# Patient Record
Sex: Female | Born: 1955 | Race: White | Hispanic: No | Marital: Single | Smoking: Never smoker
Health system: Southern US, Community
[De-identification: ages and names within clinical notes are randomized; demographics above are authoritative.]

## PROBLEM LIST (undated history)

## (undated) DIAGNOSIS — G43909 Migraine, unspecified, not intractable, without status migrainosus: Secondary | ICD-10-CM

## (undated) HISTORY — PX: NO PAST SURGERIES: SHX2092

## (undated) HISTORY — DX: Migraine, unspecified, not intractable, without status migrainosus: G43.909

---

## 2012-09-15 ENCOUNTER — Ambulatory Visit: Payer: Self-pay | Admitting: Nurse Practitioner

## 2014-05-08 ENCOUNTER — Ambulatory Visit: Payer: Self-pay | Admitting: Family Medicine

## 2015-01-16 ENCOUNTER — Ambulatory Visit: Payer: Self-pay

## 2015-02-01 ENCOUNTER — Encounter: Payer: Self-pay | Admitting: Urology

## 2015-02-01 ENCOUNTER — Ambulatory Visit (INDEPENDENT_AMBULATORY_CARE_PROVIDER_SITE_OTHER): Payer: BLUE CROSS/BLUE SHIELD | Admitting: Urology

## 2015-02-01 VITALS — BP 94/60 | HR 71 | Ht 64.75 in | Wt 137.5 lb

## 2015-02-01 DIAGNOSIS — N200 Calculus of kidney: Secondary | ICD-10-CM

## 2015-02-01 LAB — URINALYSIS, COMPLETE
Bilirubin, UA: NEGATIVE
Glucose, UA: NEGATIVE
KETONES UA: NEGATIVE
NITRITE UA: NEGATIVE
Protein, UA: NEGATIVE
SPEC GRAV UA: 1.015 (ref 1.005–1.030)
Urobilinogen, Ur: 0.2 mg/dL (ref 0.2–1.0)
pH, UA: 8.5 — ABNORMAL HIGH (ref 5.0–7.5)

## 2015-02-01 LAB — MICROSCOPIC EXAMINATION: RBC MICROSCOPIC, UA: NONE SEEN /HPF (ref 0–?)

## 2015-02-01 NOTE — Progress Notes (Signed)
Cc: Microscopic hematuria  History of present illness: 59 year old white female who was referred by Sharee Pimple, in the further evaluation and management of microscopic hematuria and left-sided nephrolithiasis. The patient initially presented to her primary care provider with right-sided flank pain. She underwent a CT scan, stone protocol, which demonstrated a 3 mm left-sided nonobstructing kidney stone. Recognizing this was not the source of her right-sided flank pain she then had spine films which were read as normal. She subsequently went to the physical therapist who then referred her to an chiropractor. This point, her right-sided flank pain has improved somewhat. However, given that she was found to have dipstick positive microscopic hematuria and the nonobstructing stone on the left, she was referred to urology for further evaluation. The patient has a history of severe/stabbing pain many years ago which she attributed to a kidney stone. This resolved on its own. She's never had a formal workup for this. She's never been told she has a kidney stone. She has no family history of kidney stones. She denies any left-sided flank pain, gross hematuria, progressive voiding symptoms, or fever/chills. In addition, the patient has no occupational exposures 2 industrial dyes or organic solvents. She is a nonsmoker. She is otherwise quite healthy. She does not take a lot of vitamin C, calcium supplements, nor medications that may cause stone formation.  A comprehensive review of systems was obtained and is otherwise negative aside from frequent urination, night sweats, and cough. The patient also has history of headaches. Past Medical History  Diagnosis Date  . Migraine    Past Surgical History  Procedure Laterality Date  . No past surgeries     No current outpatient prescriptions on file prior to visit.   No current facility-administered medications on file prior to visit.   PE: NAD Filed Vitals:    02/01/15 0843  BP: 94/60  Pulse: 71  mucosal membranes are moist Head is normocephalic, hearing normal nonlabored breathing Patient's extremities are well-perfused, no peripheral edema Her abdomen is flat There is no CVA tenderness There are no skin lesions or rashes  Mood is appropriate, affect is bright No appreciable lymphadenopathy Gait is normal, no gross neurological deficits  CT scan: I reviewed the patient's CT scan report, the images were not available to me. The formal read noted a 3 mm nonobstructing stone in the left kidney, there were no other GU abnormalities. UA demonstrates dipstick positive hematuria, no evidence of gross hematuria or infections.  Impression: The patient has an asymptomatic nonobstructing stone on the left. She has dipstick positive hematuria, no evidence of microscopic hematuria today or at her previous encounter with her primary care provider. Recommendations: I recommended that the patient treat her stone conservatively. We discussed stone prevention strategies including increased fluid intake, lemonade therapy, low salt diet, and avoidance of calcium supplements and excessive vitamin C intake. Given the patient does not have a history of kidney stones or family history of kidney stones, and she is healthy otherwise, I suggested that the patient follow-up with [place] on an as-needed basis. I suspect the stone will remain asymptomatic, but if it does, she is likely to pass it on her own. However, I'm always happy to see the patient back if things change.  Cc: Sharee Pimple, NP

## 2015-04-26 ENCOUNTER — Other Ambulatory Visit: Payer: Self-pay | Admitting: Family Medicine

## 2015-04-26 DIAGNOSIS — Z1231 Encounter for screening mammogram for malignant neoplasm of breast: Secondary | ICD-10-CM

## 2016-01-17 IMAGING — MG MM DIGITAL SCREENING BILAT W/ CAD
4 series · 4 of 4 positions shown · non-contrast
Comparison: Previous exam(s).

CLINICAL DATA: Screening.

EXAM:
DIGITAL SCREENING BILATERAL MAMMOGRAM WITH CAD

[R MLO]
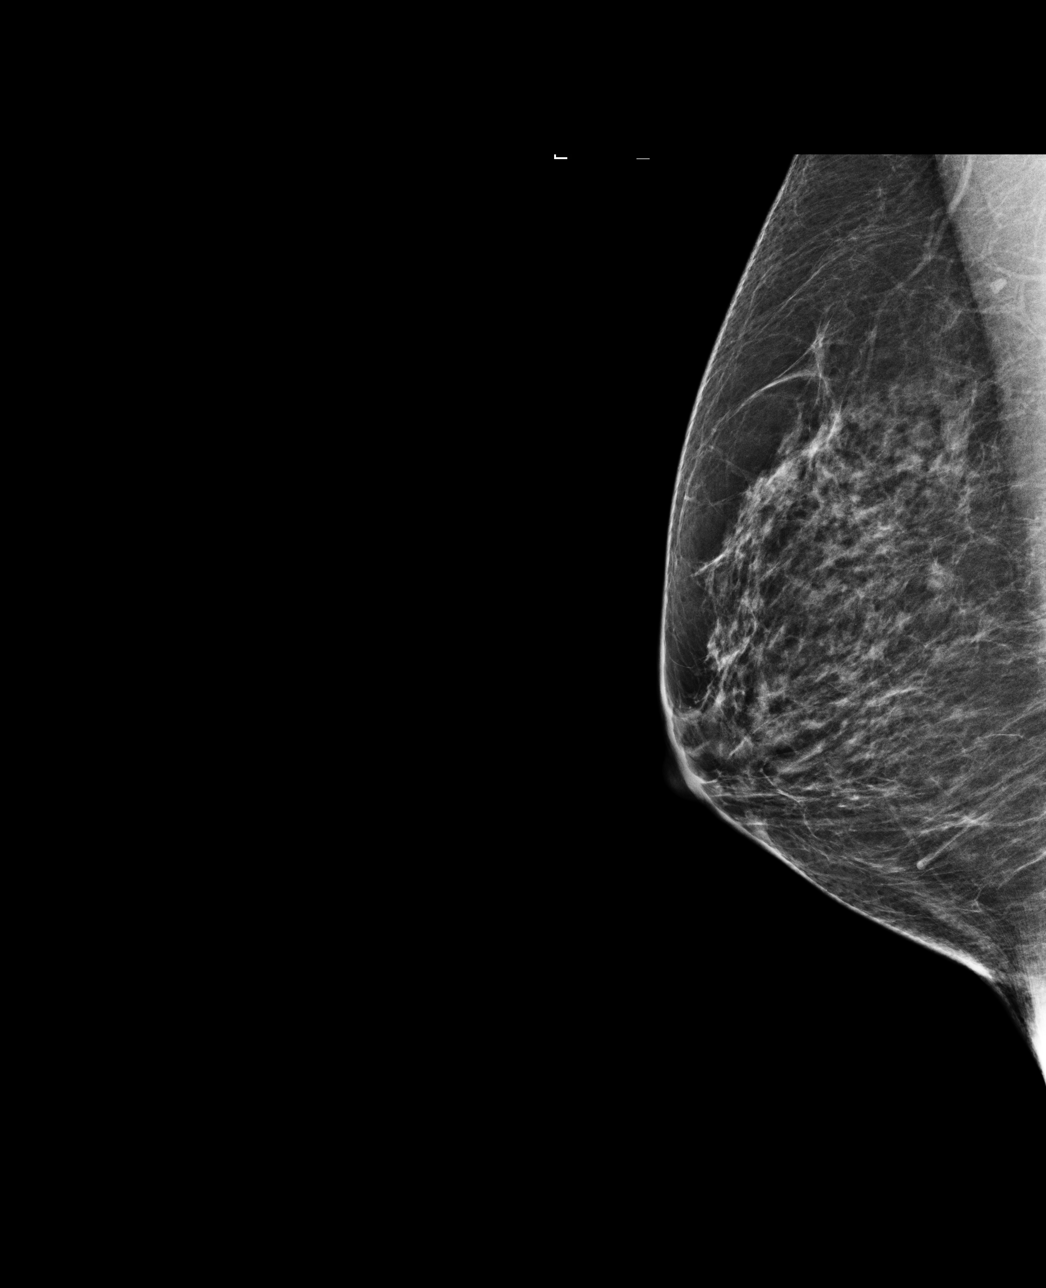

[L CC]
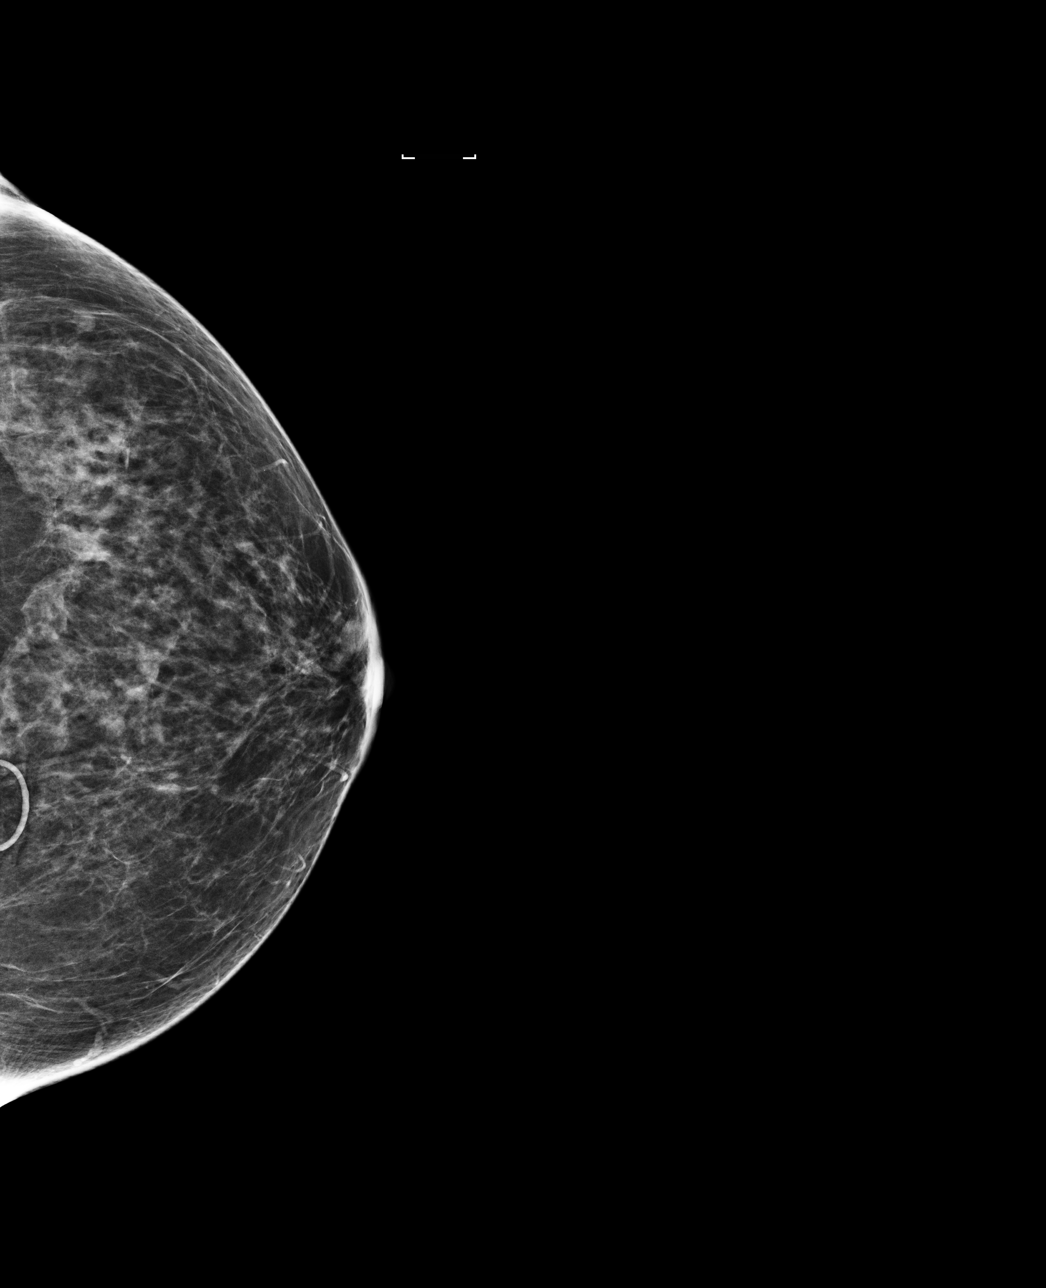

[R CC]
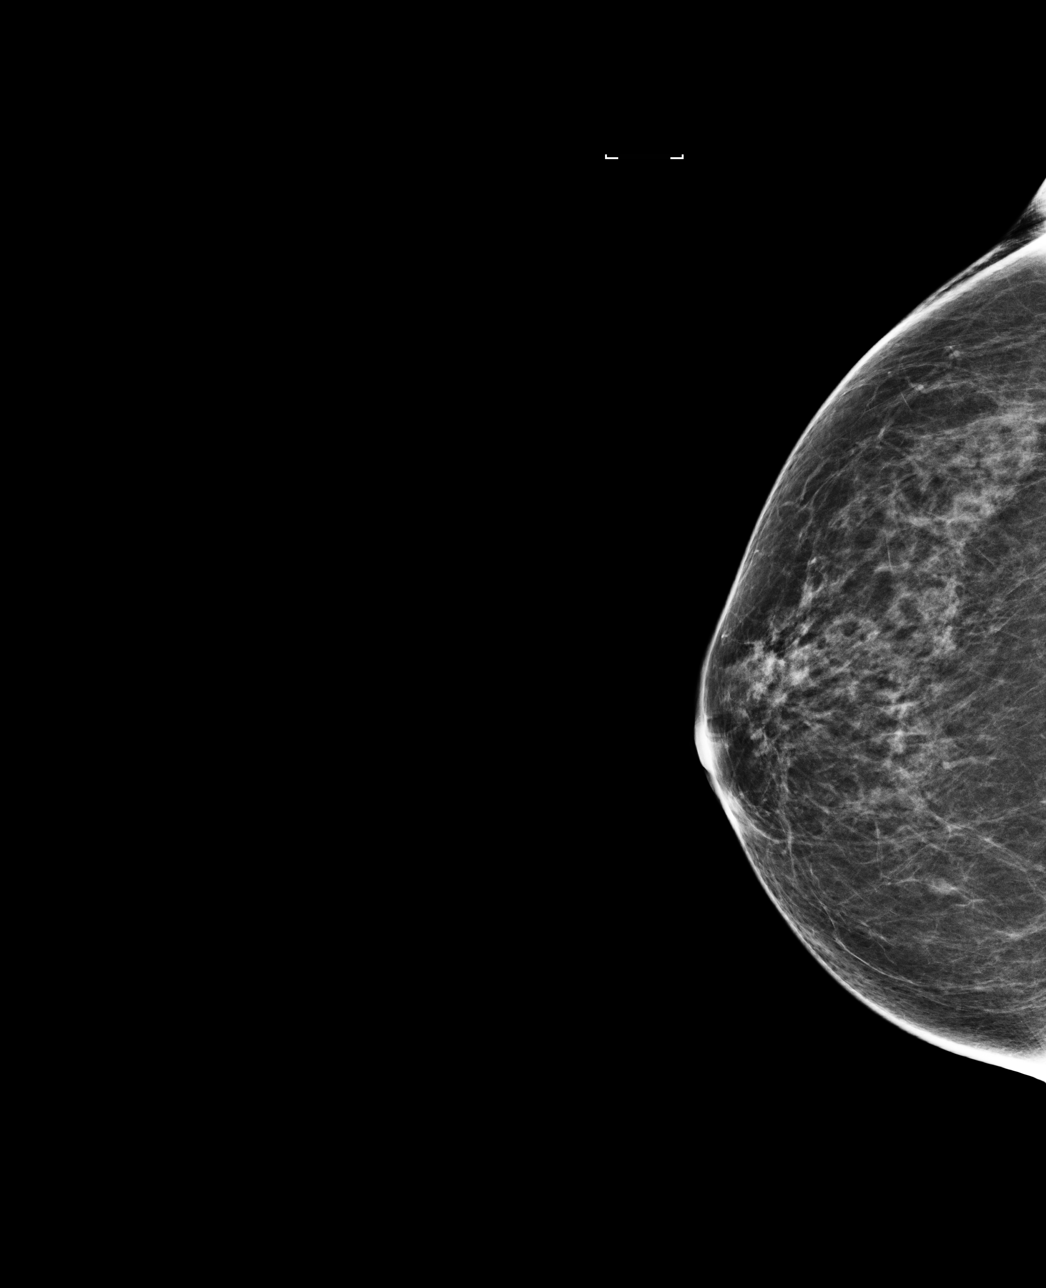

[L MLO]
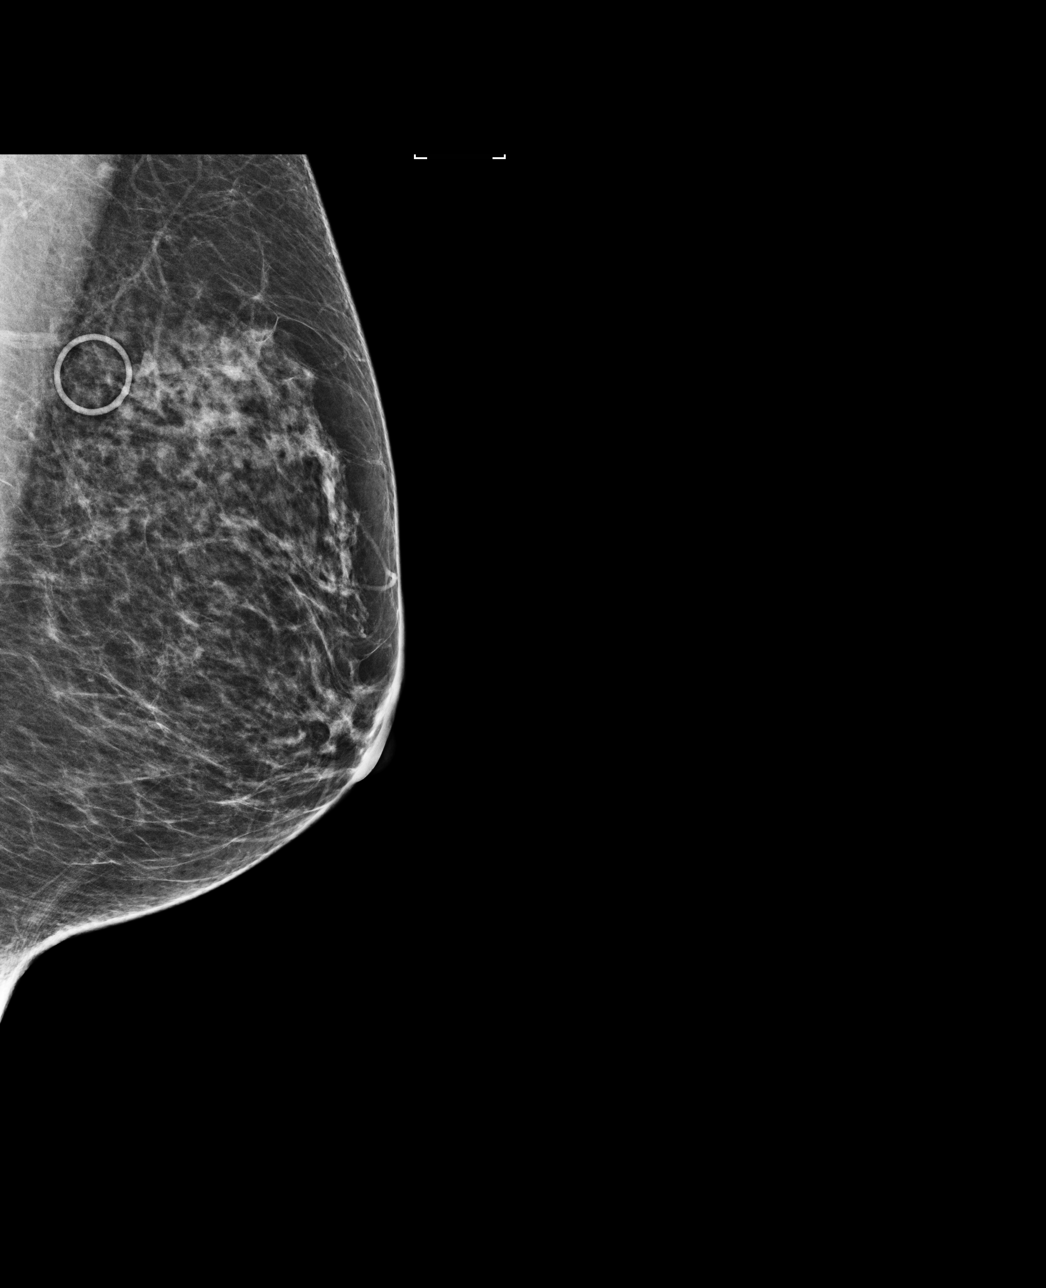

[4 of 4 positions shown; findings below may reference images not displayed]

ACR Breast Density Category c: The breast tissue is heterogeneously
dense, which may obscure small masses.
FINDINGS: There are no findings suspicious for malignancy. Images were
processed with CAD.
IMPRESSION: No mammographic evidence of malignancy. A result letter of this
screening mammogram will be mailed directly to the patient.

RECOMMENDATION:
Screening mammogram in one year. (Code:YJ-[id number])

BI-RADS CATEGORY  1: Negative.

## 2017-06-04 ENCOUNTER — Other Ambulatory Visit: Payer: Self-pay | Admitting: Family Medicine

## 2017-06-04 DIAGNOSIS — Z1231 Encounter for screening mammogram for malignant neoplasm of breast: Secondary | ICD-10-CM

## 2017-08-13 ENCOUNTER — Ambulatory Visit
Admission: RE | Admit: 2017-08-13 | Discharge: 2017-08-13 | Disposition: A | Discharge status: Home / Self Care | Payer: BLUE CROSS/BLUE SHIELD | Source: Ambulatory Visit | Attending: Family Medicine | Admitting: Family Medicine

## 2017-08-13 DIAGNOSIS — Z1231 Encounter for screening mammogram for malignant neoplasm of breast: Secondary | ICD-10-CM | POA: Diagnosis present

## 2019-04-24 IMAGING — MG MM DIGITAL SCREENING BILAT W/ CAD
4 series · 4 of 4 positions shown · non-contrast
Comparison: Previous exam(s).

CLINICAL DATA: Screening.

EXAM:
DIGITAL SCREENING BILATERAL MAMMOGRAM WITH CAD

[L MLO]
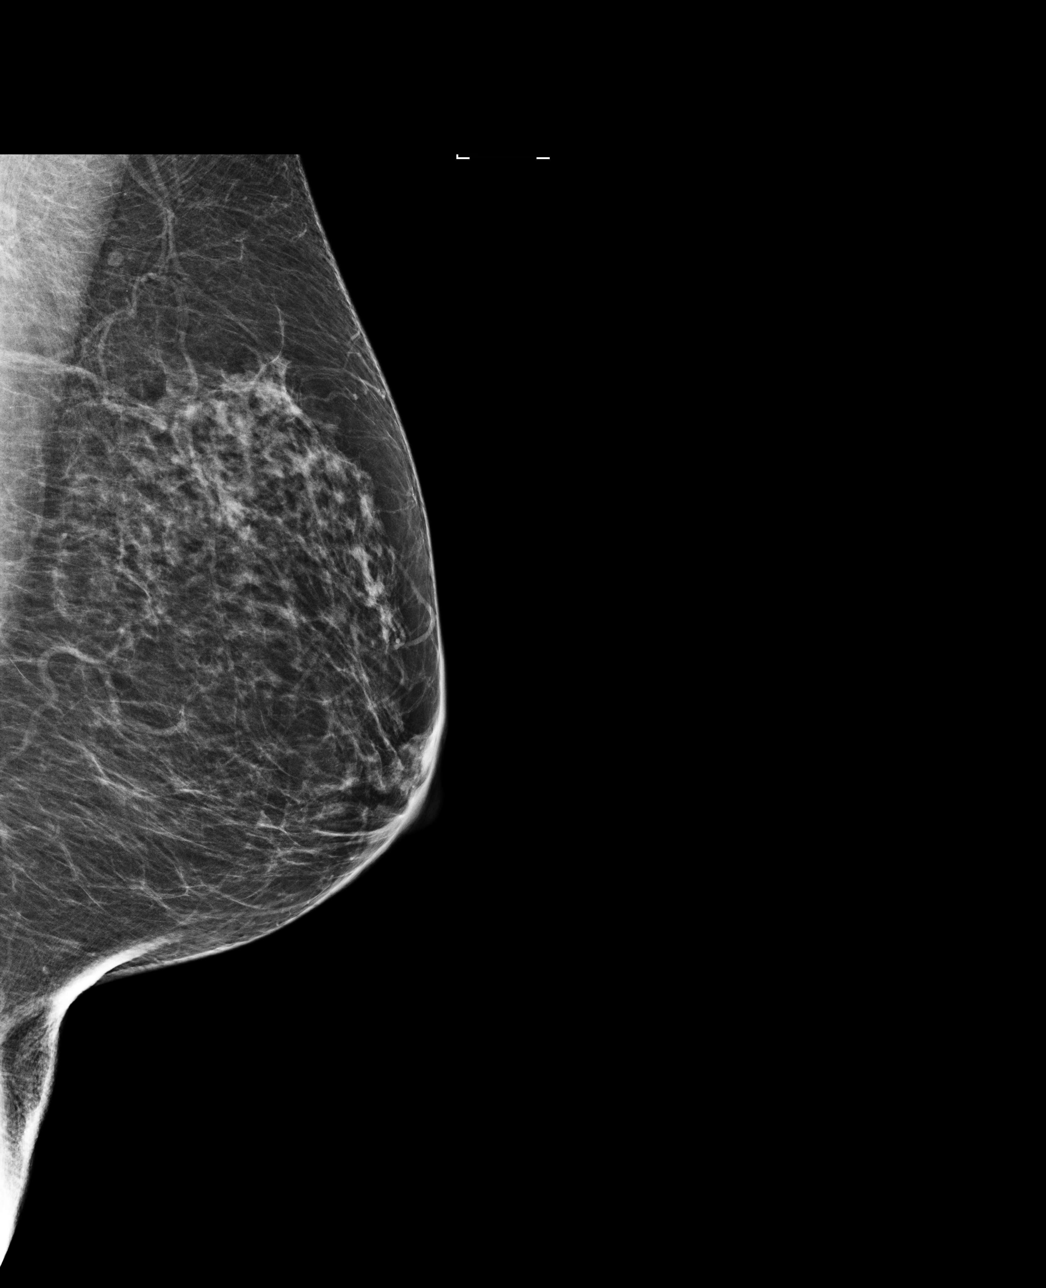

[R MLO]
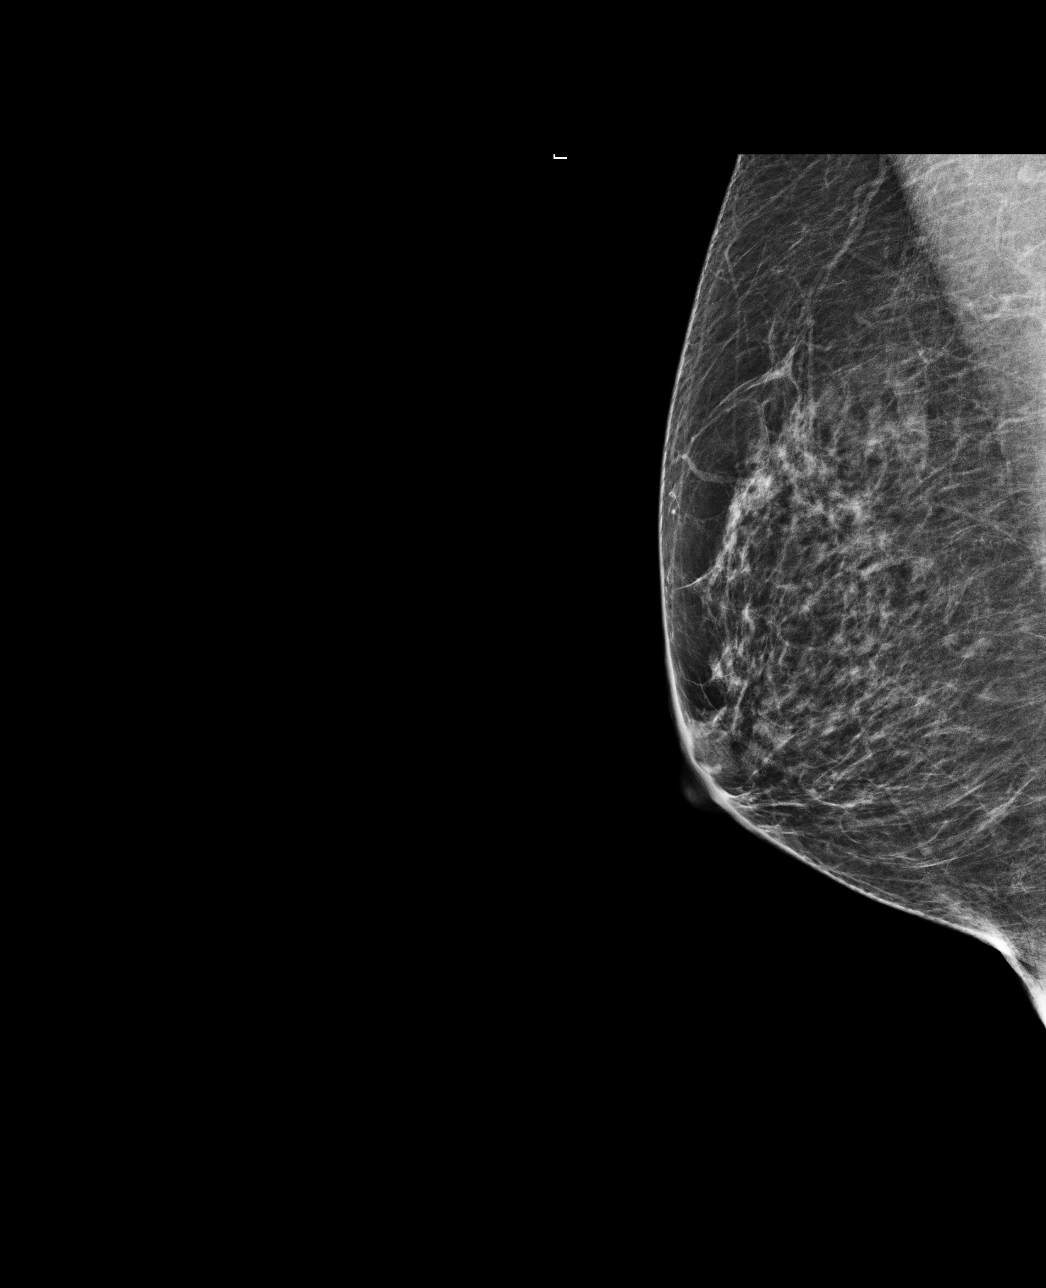

[L CC]
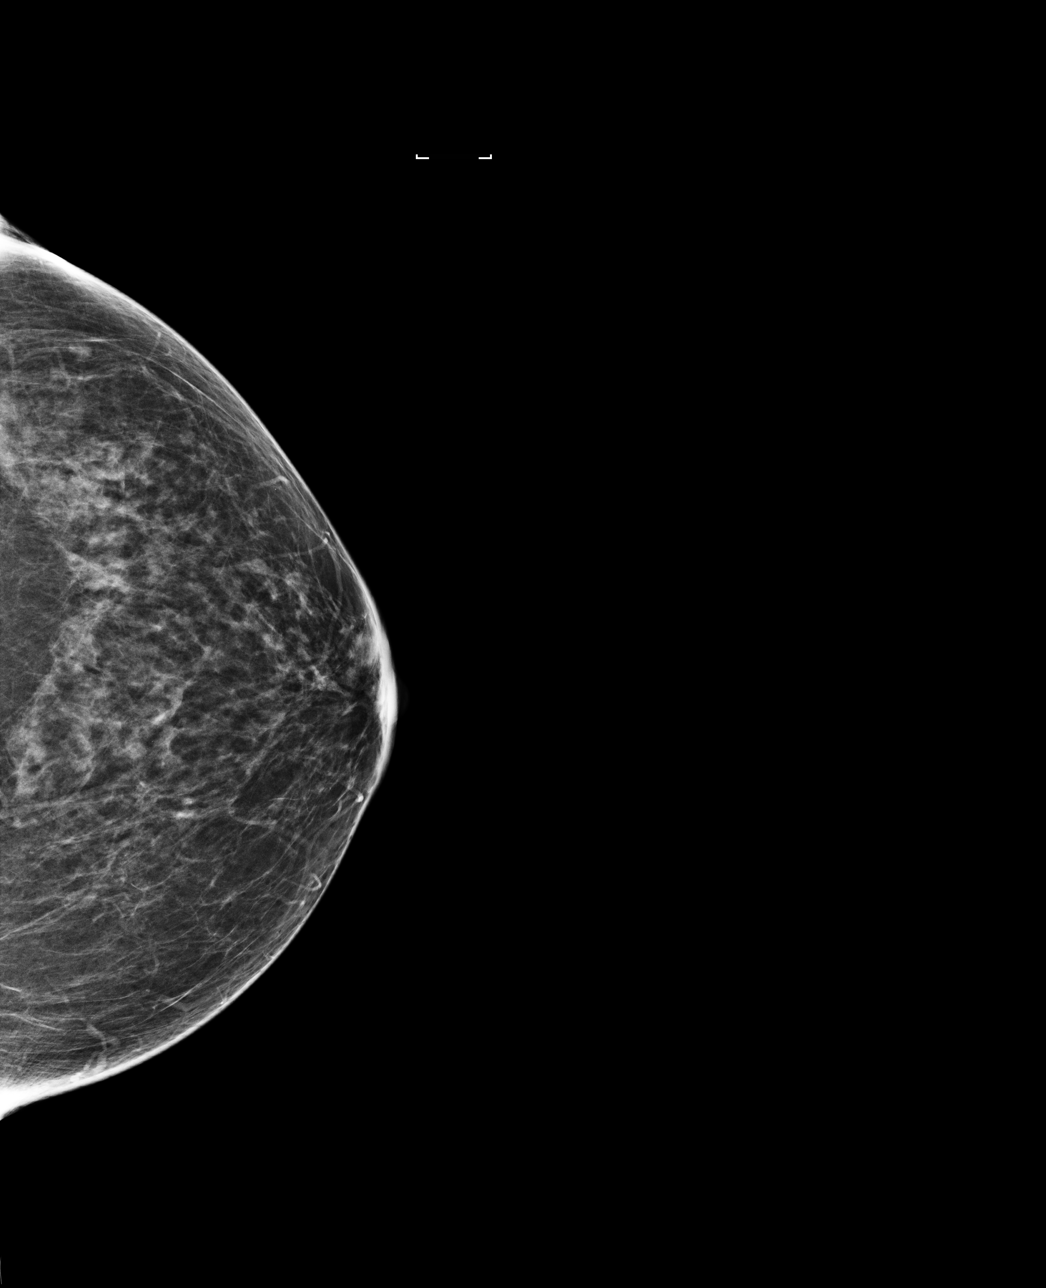

[R CC]
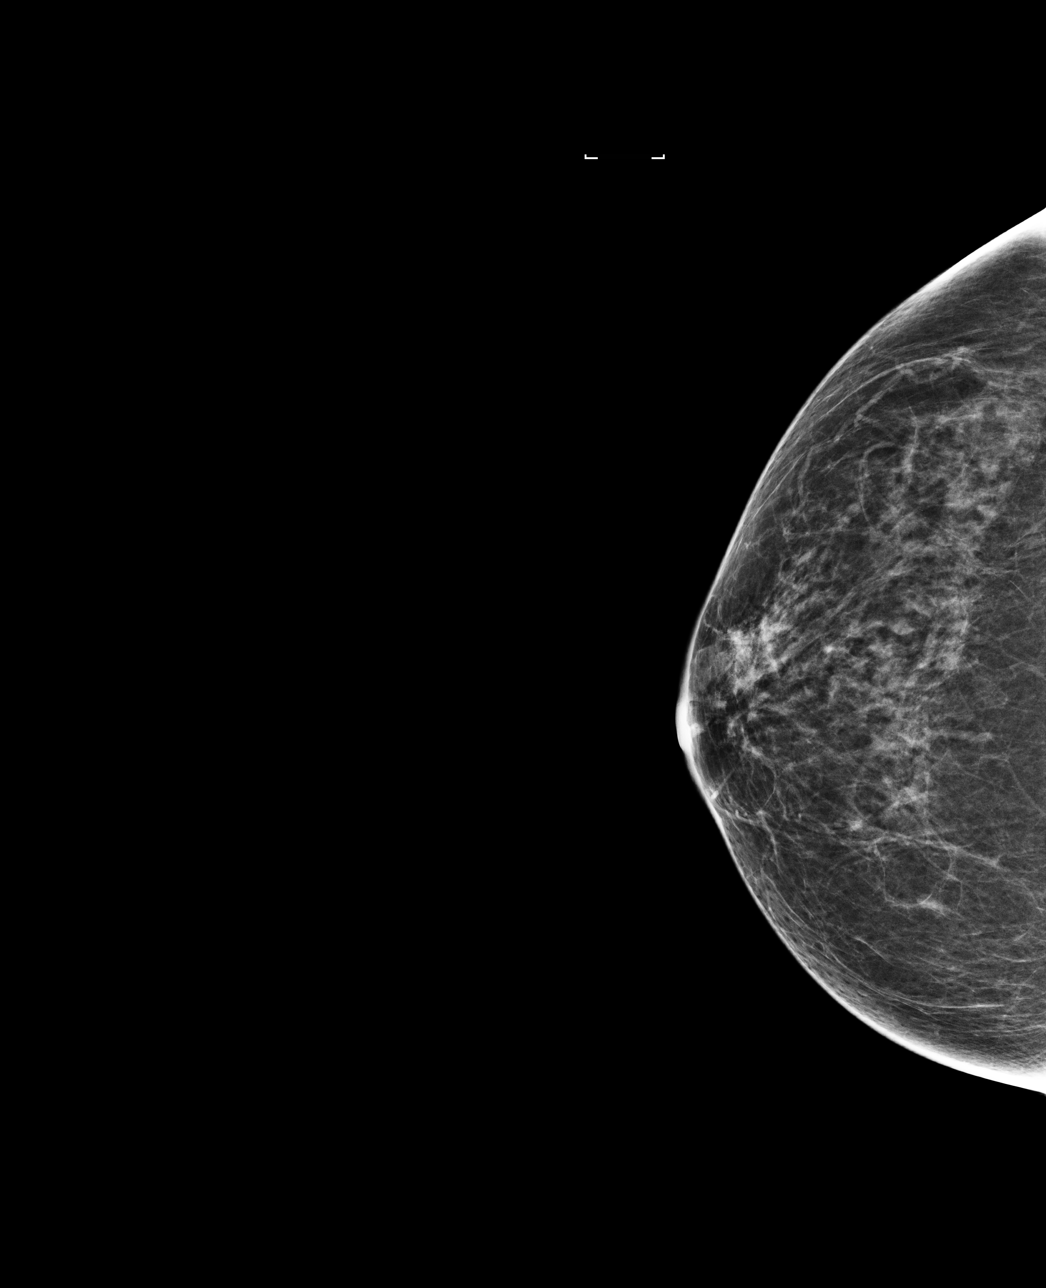

[4 of 4 positions shown; findings below may reference images not displayed]

ACR Breast Density Category b: There are scattered areas of
fibroglandular density.
FINDINGS: There are no findings suspicious for malignancy. Images were
processed with CAD.
IMPRESSION: No mammographic evidence of malignancy. A result letter of this
screening mammogram will be mailed directly to the patient.

RECOMMENDATION:
Screening mammogram in one year. (Code:[id number]-LCT)

BI-RADS CATEGORY  1: Negative.

## 2020-02-13 ENCOUNTER — Other Ambulatory Visit: Payer: Self-pay | Admitting: Family Medicine

## 2020-02-13 DIAGNOSIS — Z1231 Encounter for screening mammogram for malignant neoplasm of breast: Secondary | ICD-10-CM

## 2020-03-23 ENCOUNTER — Other Ambulatory Visit: Payer: Self-pay

## 2020-03-23 ENCOUNTER — Ambulatory Visit
Admission: RE | Admit: 2020-03-23 | Discharge: 2020-03-23 | Disposition: A | Discharge status: Home / Self Care | Payer: BLUE CROSS/BLUE SHIELD | Source: Ambulatory Visit | Attending: Family Medicine | Admitting: Family Medicine

## 2020-03-23 DIAGNOSIS — Z1231 Encounter for screening mammogram for malignant neoplasm of breast: Secondary | ICD-10-CM | POA: Diagnosis not present

## 2021-10-28 ENCOUNTER — Other Ambulatory Visit: Payer: Self-pay | Admitting: Family Medicine

## 2021-10-28 DIAGNOSIS — G43709 Chronic migraine without aura, not intractable, without status migrainosus: Secondary | ICD-10-CM

## 2021-10-28 DIAGNOSIS — R413 Other amnesia: Secondary | ICD-10-CM

## 2021-11-06 ENCOUNTER — Ambulatory Visit
Admission: RE | Admit: 2021-11-06 | Discharge: 2021-11-06 | Disposition: A | Discharge status: Home / Self Care | Payer: Medicare Other | Source: Ambulatory Visit | Attending: Family Medicine | Admitting: Family Medicine

## 2021-11-06 DIAGNOSIS — R413 Other amnesia: Secondary | ICD-10-CM | POA: Diagnosis present

## 2021-11-06 DIAGNOSIS — G43709 Chronic migraine without aura, not intractable, without status migrainosus: Secondary | ICD-10-CM | POA: Insufficient documentation

## 2022-01-21 ENCOUNTER — Telehealth: Payer: Self-pay | Admitting: Cardiology

## 2022-01-21 NOTE — Telephone Encounter (Signed)
Patient states that she received a call and someone left a message asking if she had ever seen a cardiologist before. Patient is calling back to let us know that she has not. She states that she was also trying  to do the e-check-in online and was unable to complete the form fully and cannot figure out how to get back to it.

## 2022-01-21 NOTE — Telephone Encounter (Signed)
Patient return call to report that she has never seen a cardiologist.  Patient request call back to land line for further information.

## 2022-01-21 NOTE — Telephone Encounter (Signed)
Called patient back and informed her that she can complete the check in process when she arrives for her appointment. Patient was grateful for the call back.

## 2022-01-23 ENCOUNTER — Encounter: Payer: Self-pay | Admitting: Cardiology

## 2022-01-23 ENCOUNTER — Ambulatory Visit: Payer: Medicare Other | Attending: Cardiology | Admitting: Cardiology

## 2022-01-23 VITALS — BP 100/70 | HR 71 | Ht 65.0 in | Wt 127.2 lb

## 2022-01-23 DIAGNOSIS — I6529 Occlusion and stenosis of unspecified carotid artery: Secondary | ICD-10-CM

## 2022-01-23 DIAGNOSIS — E7800 Pure hypercholesterolemia, unspecified: Secondary | ICD-10-CM

## 2022-01-23 MED ORDER — ASPIRIN 81 MG PO TBEC: 81.0000 mg | DELAYED_RELEASE_TABLET | Freq: Every day | ORAL | 3 refills | Status: AC

## 2022-01-23 MED ORDER — ATORVASTATIN CALCIUM 40 MG PO TABS: 40.0000 mg | ORAL_TABLET | Freq: Every day | ORAL | 5 refills | Status: DC

## 2022-01-23 NOTE — Patient Instructions (Signed)
Medication Instructions:   Your physician has recommended you make the following change in your medication:   START taking Atorvastatin 40 MG once a day.  2.   START Aspirin 81 MG once a day.   *If you need a refill on your cardiac medications before your next appointment, please call your pharmacy*   Lab Work:   Your physician recommends that you return for a FASTING lipid profile:  The week prior to your 2 month follow up  - You will need to be fasting. Please do not have anything to eat or drink after midnight the morning you have the lab work. You may only have water or black coffee with no cream or sugar.   - Please go to the [facility]. You will check in at the front desk to the right as you walk into the atrium. Valet Parking is offered if needed. - No appointment needed. You may go any day between 7 am and 6 pm.    Follow-Up: At [facility], you and your health needs are our priority.  As part of our continuing mission to provide you with exceptional heart care, we have created designated Provider Care Teams.  These Care Teams include your primary Cardiologist (physician) and Advanced Practice Providers (APPs -  Physician Assistants and Nurse Practitioners) who all work together to provide you with the care you need, when you need it.  We recommend signing up for the patient portal called "MyChart".  Sign up information is provided on this After Visit Summary.  MyChart is used to connect with patients for Virtual Visits (Telemedicine).  Patients are able to view lab/test results, encounter notes, upcoming appointments, etc.  Non-urgent messages can be sent to your provider as well.   To learn more about what you can do with MyChart, go to [email or web address].    Your next appointment:   2 month(s)  The format for your next appointment:   In Person  Provider:   You may see Debbe Odea, MD or one of the following Advanced Practice Providers on your  designated Care Team:   Nicolasa Ducking, NP Eula Listen, PA-C Cadence Fransico Michael, PA-C Charlsie Quest, NP    Other Instructions   Important Information About Sugar

## 2022-01-23 NOTE — Progress Notes (Signed)
Cardiology Office Note:    Date:  01/23/2022   ID:  Sharon Chavez, DOB 1955/08/29, MRN [id number]  PCP:  Leanna Sato, MD   [facility] Providers Cardiologist:  None     Referring MD: Leanna Sato, MD   Chief Complaint  Patient presents with   New Patient (Initial Visit)    Ref by Dr. 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 Medications reviewed by the patient verbally.     History of Present Illness:    Sharon Chavez is a 66 y.o. female with a hx of migraines who presents due to internal carotid artery stenosis.  Patient complains of excruciating headaches last month for about 3 weeks straight.  Her Imitrex was not helping.  Symptoms finally subsided, obtain head CT which showed internal carotid artery stenosis.  Denies chest pain or shortness of breath.  Recent cholesterol showed mildly elevated LDL of 105, total cholesterol 201.  Has family history of migraines.  Denies smoking.  Past Medical History:  Diagnosis Date   Migraine     Past Surgical History:  Procedure Laterality Date   NO PAST SURGERIES      Current Medications: Current Meds  Medication Sig   aspirin EC 81 MG tablet Take 1 tablet (81 mg total) by mouth daily. Swallow whole.   atorvastatin (LIPITOR) 40 MG tablet Take 1 tablet (40 mg total) by mouth daily.     Allergies:   Patient has no known allergies.   Social History   Socioeconomic History   Marital status: Single    Spouse name: Not on file   Number of children: Not on file   Years of education: Not on file   Highest education level: Not on file  Occupational History   Not on file  Tobacco Use   Smoking status: Never   Smokeless tobacco: Not on file  Vaping Use   Vaping Use: Never used  Substance and Sexual Activity   Alcohol use: Yes    Alcohol/week: 0.0 standard drinks of alcohol   Drug use: No   Sexual activity: Not on file  Other Topics Concern   Not on  file  Social History Narrative   Not on file   Social Determinants of Health   Financial Resource Strain: Not on file  Food Insecurity: Not on file  Transportation Needs: Not on file  Physical Activity: Not on file  Stress: Not on file  Social Connections: Not on file     Family History: The patient's family history includes Breast cancer in her mother and sister; Heart disease in her maternal grandfather and maternal grandmother; Stroke in her father and mother. There is no history of Prostate cancer or Bladder Cancer.  ROS:   Please see the history of present illness.     All other systems reviewed and are negative.  EKGs/Labs/Other Studies Reviewed:    The following studies were reviewed today:   EKG:  EKG is  ordered today.  The ekg ordered today demonstrates normal sinus rhythm, cannot rule out anterior infarct.  Recent Labs: No results found for requested labs within last 365 days.  Recent Lipid Panel No results found for: "CHOL", "TRIG", "HDL", "CHOLHDL", "VLDL", "LDLCALC", "LDLDIRECT"   Risk Assessment/Calculations:             Physical Exam:    VS:  BP 100/70 (BP Location: Left Arm, Patient Position: Sitting, Cuff Size: Normal)   Pulse 71  Ht 5\' 5"  (1.651 m)   Wt 127 lb 4 oz (57.7 kg)   SpO2 98%   BMI 21.18 kg/m     Wt Readings from Last 3 Encounters:  01/23/22 127 lb 4 oz (57.7 kg)  02/01/15 137 lb 8 oz (62.4 kg)     GEN:  Well nourished, well developed in no acute distress HEENT: Normal NECK: No JVD; No carotid bruits CARDIAC: RRR, no murmurs, rubs, gallops RESPIRATORY:  Clear to auscultation without rales, wheezing or rhonchi  ABDOMEN: Soft, non-tender, non-distended MUSCULOSKELETAL:  No edema; No deformity  SKIN: Warm and dry NEUROLOGIC:  Alert and oriented x 3 PSYCHIATRIC:  Normal affect   ASSESSMENT:    1. Internal carotid artery stenosis, unspecified laterality   2. Pure hypercholesterolemia    PLAN:    In order of problems  listed above:  Internal carotid artery stenosis, start aspirin, start Lipitor 40 mg daily.  Goal LDL less than 70.  Refer to vascular surgery.  Obtain carotid artery ultrasound. Hyperlipidemia, goal LDL less than 70.  Lipitor as above.  Repeat lipid panel in 2 months.  Follow-up in 2 months        Medication Adjustments/Labs and Tests Ordered: Current medicines are reviewed at length with the patient today.  Concerns regarding medicines are outlined above.  Orders Placed This Encounter  Procedures   Ambulatory referral to Vascular Surgery   EKG 12-Lead   Meds ordered this encounter  Medications   atorvastatin (LIPITOR) 40 MG tablet    Sig: Take 1 tablet (40 mg total) by mouth daily.    Dispense:  30 tablet    Refill:  5   aspirin EC 81 MG tablet    Sig: Take 1 tablet (81 mg total) by mouth daily. Swallow whole.    Dispense:  90 tablet    Refill:  3    Patient Instructions  Medication Instructions:   Your physician has recommended you make the following change in your medication:   START taking Atorvastatin 40 MG once a day.  2.   START Aspirin 81 MG once a day.   *If you need a refill on your cardiac medications before your next appointment, please call your pharmacy*   Lab Work:   Your physician recommends that you return for a FASTING lipid profile:  The week prior to your 2 month follow up  - You will need to be fasting. Please do not have anything to eat or drink after midnight the morning you have the lab work. You may only have water or black coffee with no cream or sugar.   - Please go to the [facility]. You will check in at the front desk to the right as you walk into the atrium. Valet Parking is offered if needed. - No appointment needed. You may go any day between 7 am and 6 pm.    Follow-Up: At [facility], you and your health needs are our priority.  As part of our continuing mission to provide you with exceptional heart care, we  have created designated Provider Care Teams.  These Care Teams include your primary Cardiologist (physician) and Advanced Practice Providers (APPs -  Physician Assistants and Nurse Practitioners) who all work together to provide you with the care you need, when you need it.  We recommend signing up for the patient portal called "MyChart".  Sign up information is provided on this After Visit Summary.  MyChart is used to connect with patients  for Virtual Visits (Telemedicine).  Patients are able to view lab/test results, encounter notes, upcoming appointments, etc.  Non-urgent messages can be sent to your provider as well.   To learn more about what you can do with MyChart, go to [email or web address].    Your next appointment:   2 month(s)  The format for your next appointment:   In Person  Provider:   You may see Debbe Odea, MD or one of the following Advanced Practice Providers on your designated Care Team:   Nicolasa Ducking, NP Eula Listen, PA-C Cadence Fransico Michael, PA-C Charlsie Quest, NP    Other Instructions   Important Information About Sugar         Signed, Debbe Odea, MD  01/23/2022 10:33 AM    [facility]

## 2022-01-28 ENCOUNTER — Other Ambulatory Visit (INDEPENDENT_AMBULATORY_CARE_PROVIDER_SITE_OTHER): Payer: Self-pay | Admitting: Cardiology

## 2022-01-28 DIAGNOSIS — I6529 Occlusion and stenosis of unspecified carotid artery: Secondary | ICD-10-CM

## 2022-01-29 ENCOUNTER — Ambulatory Visit (INDEPENDENT_AMBULATORY_CARE_PROVIDER_SITE_OTHER): Payer: Self-pay

## 2022-01-29 DIAGNOSIS — I6529 Occlusion and stenosis of unspecified carotid artery: Secondary | ICD-10-CM

## 2022-02-09 ENCOUNTER — Encounter: Payer: Self-pay | Admitting: Cardiology

## 2022-02-13 ENCOUNTER — Other Ambulatory Visit: Payer: Self-pay

## 2022-02-13 MED ORDER — PRAVASTATIN SODIUM 20 MG PO TABS: 20.0000 mg | ORAL_TABLET | Freq: Every evening | ORAL | 0 refills | Status: DC

## 2022-02-13 NOTE — Progress Notes (Signed)
See MyChart message

## 2022-03-10 ENCOUNTER — Encounter (INDEPENDENT_AMBULATORY_CARE_PROVIDER_SITE_OTHER): Payer: Self-pay

## 2022-03-17 ENCOUNTER — Other Ambulatory Visit: Payer: Self-pay

## 2022-03-17 ENCOUNTER — Other Ambulatory Visit
Admission: RE | Admit: 2022-03-17 | Discharge: 2022-03-17 | Disposition: A | Discharge status: Home / Self Care | Payer: Medicare Other | Attending: Cardiology | Admitting: Cardiology

## 2022-03-17 DIAGNOSIS — E7800 Pure hypercholesterolemia, unspecified: Secondary | ICD-10-CM | POA: Insufficient documentation

## 2022-03-17 LAB — LIPID PANEL
Cholesterol: 159 mg/dL (ref 0–200)
HDL: 79 mg/dL (ref >40)
LDL Cholesterol: 71 mg/dL (ref 0–99)
Total CHOL/HDL Ratio: 2 RATIO
Triglycerides: 45 mg/dL (ref <150)
VLDL: 9 mg/dL (ref 0–40)

## 2022-03-25 ENCOUNTER — Encounter: Payer: Self-pay | Admitting: Cardiology

## 2022-03-25 ENCOUNTER — Ambulatory Visit: Payer: Medicare Other | Attending: Cardiology | Admitting: Cardiology

## 2022-03-25 VITALS — BP 98/62 | HR 63 | Ht 65.0 in | Wt 124.8 lb

## 2022-03-25 DIAGNOSIS — I6529 Occlusion and stenosis of unspecified carotid artery: Secondary | ICD-10-CM

## 2022-03-25 DIAGNOSIS — E7800 Pure hypercholesterolemia, unspecified: Secondary | ICD-10-CM

## 2022-03-25 MED ORDER — PRAVASTATIN SODIUM 10 MG PO TABS: 10.0000 mg | ORAL_TABLET | Freq: Every evening | ORAL | 3 refills | Status: DC

## 2022-03-25 NOTE — Patient Instructions (Addendum)
Medication Instructions:   Your physician has recommended you make the following change in your medication:     DECREASE your Pravastatin to 10 MG once a day.  *If you need a refill on your cardiac medications before your next appointment, please call your pharmacy*    Follow-Up: At [facility], you and your health needs are our priority.  As part of our continuing mission to provide you with exceptional heart care, we have created designated Provider Care Teams.  These Care Teams include your primary Cardiologist (physician) and Advanced Practice Providers (APPs -  Physician Assistants and Nurse Practitioners) who all work together to provide you with the care you need, when you need it.  We recommend signing up for the patient portal called "MyChart".  Sign up information is provided on this After Visit Summary.  MyChart is used to connect with patients for Virtual Visits (Telemedicine).  Patients are able to view lab/test results, encounter notes, upcoming appointments, etc.  Non-urgent messages can be sent to your provider as well.   To learn more about what you can do with MyChart, go to [email or web address].    Your next appointment:   Follow up as needed   The format for your next appointment:   In Person  Provider:   You may see Debbe Odea, MD or one of the following Advanced Practice Providers on your designated Care Team:   Nicolasa Ducking, NP Eula Listen, PA-C Cadence Fransico Michael, PA-C Charlsie Quest, NP    Other Instructions   Important Information About Sugar

## 2022-03-25 NOTE — Progress Notes (Signed)
Cardiology Office Note:    Date:  03/25/2022   ID:  Sharon Chavez, DOB 1955/11/25, MRN [id number]  PCP:  Leanna Sato, MD   [facility] Providers Cardiologist:  None     Referring MD: Leanna Sato, MD   Chief Complaint  Patient presents with   Follow-up    2 month f/u,  no new cardiac concerns     History of Present Illness:    Sharon Chavez is a 66 y.o. female with a hx of migraines, intracranial internal carotid calcifications who presents for follow-up.  Previously seen due to internal carotid artery stenosis.  History of migraines, underwent head CT 10/2021, intracranial carotid artery calcifications noted.  Extracranial carotid artery ultrasound was ordered, aspirin and statin was started.  Did not tolerate Lipitor due to myalgias, this was switched to Pravachol.  She states muscle aches and joint pains are much improved.  Cholesterol is better.  Wondering if she can reduce Pravachol further.  Previously referred to vascular surgery.  Takes medications as prescribed,   Past Medical History:  Diagnosis Date   Migraine     Past Surgical History:  Procedure Laterality Date   NO PAST SURGERIES      Current Medications: Current Meds  Medication Sig   aspirin EC 81 MG tablet Take 1 tablet (81 mg total) by mouth daily. Swallow whole.   LIDODERM 5 % Place 1 patch onto the skin daily as needed (back pain).   SUMAtriptan (IMITREX) 100 MG tablet    [DISCONTINUED] pravastatin (PRAVACHOL) 20 MG tablet Take 1 tablet (20 mg total) by mouth every evening.     Allergies:   Patient has no known allergies.   Social History   Socioeconomic History   Marital status: Single    Spouse name: Not on file   Number of children: Not on file   Years of education: Not on file   Highest education level: Not on file  Occupational History   Not on file  Tobacco Use   Smoking status: Never   Smokeless tobacco: Not on file  Vaping Use   Vaping Use: Never used  Substance  and Sexual Activity   Alcohol use: Yes    Alcohol/week: 0.0 standard drinks of alcohol   Drug use: No   Sexual activity: Not on file  Other Topics Concern   Not on file  Social History Narrative   Not on file   Social Determinants of Health   Financial Resource Strain: Not on file  Food Insecurity: Not on file  Transportation Needs: Not on file  Physical Activity: Not on file  Stress: Not on file  Social Connections: Not on file     Family History: The patient's family history includes Breast cancer in her mother and sister; Heart disease in her maternal grandfather and maternal grandmother; Stroke in her father and mother. There is no history of Prostate cancer or Bladder Cancer.  ROS:   Please see the history of present illness.     All other systems reviewed and are negative.  EKGs/Labs/Other Studies Reviewed:    The following studies were reviewed today:   EKG:  EKG is  ordered today.  The ekg ordered today demonstrates normal sinus rhythm, low voltage QRS  Recent Labs: No results found for requested labs within last 365 days.  Recent Lipid Panel    Component Value Date/Time   CHOL 159 03/17/2022 1110   TRIG 45 03/17/2022 1110   HDL 79  03/17/2022 1110   CHOLHDL 2.0 03/17/2022 1110   VLDL 9 03/17/2022 1110   LDLCALC 71 03/17/2022 1110     Risk Assessment/Calculations:             Physical Exam:    VS:  BP 98/62 (BP Location: Left Arm, Patient Position: Sitting, Cuff Size: Normal)   Pulse 63   Ht 5\' 5"  (1.651 m)   Wt 124 lb 12.8 oz (56.6 kg)   SpO2 98%   BMI 20.77 kg/m     Wt Readings from Last 3 Encounters:  03/25/22 124 lb 12.8 oz (56.6 kg)  01/23/22 127 lb 4 oz (57.7 kg)  02/01/15 137 lb 8 oz (62.4 kg)     GEN:  Well nourished, well developed in no acute distress HEENT: Normal NECK: No JVD; No carotid bruits CARDIAC: RRR, no murmurs, rubs, gallops RESPIRATORY:  Clear to auscultation without rales, wheezing or rhonchi  ABDOMEN: Soft,  non-tender, non-distended MUSCULOSKELETAL:  No edema; No deformity  SKIN: Warm and dry NEUROLOGIC:  Alert and oriented x 3 PSYCHIATRIC:  Normal affect   ASSESSMENT:    1. Internal carotid artery stenosis, unspecified laterality   2. Pure hypercholesterolemia    PLAN:    In order of problems listed above:  Intracranial internal carotid artery stenosis, continue aspirin, reduce Pravachol to 10 mg daily.  Previously referred to vascular surgery.  Carotid ultrasound extracranial did not show any significant stenosis.  Patient has history of migraines, advised to follow-up with PCP/neurology for any additional input. Hyperlipidemia, LDL much improved at 71 (prior 105), Pravachol 10 mg daily.  Did not tolerate Lipitor.  Follow-up as needed      Medication Adjustments/Labs and Tests Ordered: Current medicines are reviewed at length with the patient today.  Concerns regarding medicines are outlined above.  Orders Placed This Encounter  Procedures   EKG 12-Lead   Meds ordered this encounter  Medications   pravastatin (PRAVACHOL) 10 MG tablet    Sig: Take 1 tablet (10 mg total) by mouth every evening.    Dispense:  90 tablet    Refill:  3    Stop Atorvastatin.    Patient Instructions  Medication Instructions:   Your physician has recommended you make the following change in your medication:     DECREASE your Pravastatin to 10 MG once a day.  *If you need a refill on your cardiac medications before your next appointment, please call your pharmacy*    Follow-Up: At [facility], you and your health needs are our priority.  As part of our continuing mission to provide you with exceptional heart care, we have created designated Provider Care Teams.  These Care Teams include your primary Cardiologist (physician) and Advanced Practice Providers (APPs -  Physician Assistants and Nurse Practitioners) who all work together to provide you with the care you need, when you need  it.  We recommend signing up for the patient portal called "MyChart".  Sign up information is provided on this After Visit Summary.  MyChart is used to connect with patients for Virtual Visits (Telemedicine).  Patients are able to view lab/test results, encounter notes, upcoming appointments, etc.  Non-urgent messages can be sent to your provider as well.   To learn more about what you can do with MyChart, go to [facility].    Your next appointment:   Follow up as needed   The format for your next appointment:   In Person  Provider:   You may see  Debbe Odea, MD or one of the following Advanced Practice Providers on your designated Care Team:   Nicolasa Ducking, NP Eula Listen, PA-C Cadence Fransico Michael, [place] Charlsie Quest, NP    Other Instructions   Important Information About Sugar         Signed, Debbe Odea, MD  03/25/2022 10:05 AM    [facility]

## 2023-03-26 ENCOUNTER — Other Ambulatory Visit: Payer: Self-pay | Admitting: Family Medicine

## 2023-03-26 DIAGNOSIS — Z1231 Encounter for screening mammogram for malignant neoplasm of breast: Secondary | ICD-10-CM

## 2023-03-27 ENCOUNTER — Ambulatory Visit
Admission: RE | Admit: 2023-03-27 | Discharge: 2023-03-27 | Disposition: A | Discharge status: Home / Self Care | Payer: Medicare Other | Source: Ambulatory Visit | Attending: Family Medicine

## 2023-03-27 DIAGNOSIS — Z1231 Encounter for screening mammogram for malignant neoplasm of breast: Secondary | ICD-10-CM

## 2023-04-08 ENCOUNTER — Other Ambulatory Visit: Payer: Self-pay | Admitting: Family Medicine

## 2023-04-08 DIAGNOSIS — Z780 Asymptomatic menopausal state: Secondary | ICD-10-CM

## 2024-02-03 ENCOUNTER — Inpatient Hospital Stay

## 2024-02-03 ENCOUNTER — Encounter: Payer: Self-pay | Admitting: Oncology

## 2024-02-03 ENCOUNTER — Inpatient Hospital Stay: Attending: Oncology | Admitting: Oncology

## 2024-02-03 VITALS — BP 99/69 | HR 68 | Temp 97.2°F | Resp 18 | Wt 126.1 lb

## 2024-02-03 DIAGNOSIS — D7589 Other specified diseases of blood and blood-forming organs: Secondary | ICD-10-CM

## 2024-02-03 DIAGNOSIS — R61 Generalized hyperhidrosis: Secondary | ICD-10-CM | POA: Diagnosis not present

## 2024-02-03 DIAGNOSIS — Z803 Family history of malignant neoplasm of breast: Secondary | ICD-10-CM | POA: Insufficient documentation

## 2024-02-03 LAB — COMPREHENSIVE METABOLIC PANEL WITH GFR
ALT: 19 U/L (ref 0–44)
AST: 25 U/L (ref 15–41)
Albumin: 4.4 g/dL (ref 3.5–5.0)
Alkaline Phosphatase: 70 U/L (ref 38–126)
Anion gap: 7 (ref 5–15)
BUN: 25 mg/dL — ABNORMAL HIGH (ref 8–23)
CO2: 25 mmol/L (ref 22–32)
Calcium: 9.3 mg/dL (ref 8.9–10.3)
Chloride: 104 mmol/L (ref 98–111)
Creatinine, Ser: 0.69 mg/dL (ref 0.44–1.00)
GFR, Estimated: >60 mL/min (ref >60)
Glucose, Bld: 98 mg/dL (ref 70–99)
Potassium: 4.3 mmol/L (ref 3.5–5.1)
Sodium: 136 mmol/L (ref 135–145)
Total Bilirubin: 0.9 mg/dL (ref 0.0–1.2)
Total Protein: 7.2 g/dL (ref 6.5–8.1)

## 2024-02-03 LAB — CBC WITH DIFFERENTIAL/PLATELET
Abs Immature Granulocytes: 0.02 K/uL (ref 0.00–0.07)
Basophils Absolute: 0 K/uL (ref 0.0–0.1)
Basophils Relative: 1 %
Eosinophils Absolute: 0.1 K/uL (ref 0.0–0.5)
Eosinophils Relative: 2 %
HCT: 43.9 % (ref 36.0–46.0)
Hemoglobin: 14.9 g/dL (ref 12.0–15.0)
Immature Granulocytes: 1 %
Lymphocytes Relative: 32 %
Lymphs Abs: 1.4 K/uL (ref 0.7–4.0)
MCH: 34.3 pg — ABNORMAL HIGH (ref 26.0–34.0)
MCHC: 33.9 g/dL (ref 30.0–36.0)
MCV: 100.9 fL — ABNORMAL HIGH (ref 80.0–100.0)
Monocytes Absolute: 0.3 K/uL (ref 0.1–1.0)
Monocytes Relative: 8 %
Neutro Abs: 2.4 K/uL (ref 1.7–7.7)
Neutrophils Relative %: 56 %
Platelets: 184 K/uL (ref 150–400)
RBC: 4.35 MIL/uL (ref 3.87–5.11)
RDW: 11.8 % (ref 11.5–15.5)
WBC: 4.3 K/uL (ref 4.0–10.5)
nRBC: 0 % (ref 0.0–0.2)

## 2024-02-03 LAB — LACTATE DEHYDROGENASE: LDH: 155 U/L (ref 98–192)

## 2024-02-03 NOTE — Assessment & Plan Note (Signed)
 Macrocytosis without anemia. Adequate B12 and folate level. Check CBC, smear, multiple myeloma panel, light chain ratio, LDH, haptoglobin, CMP, MMA. Today's CBC showed decreased MCV to 100.

## 2024-02-03 NOTE — Progress Notes (Signed)
 Hematology/Oncology Consult note [phone] [phone]        REFERRING PROVIDER: Buren Rock HERO, MD   CHIEF COMPLAINTS/REASON FOR VISIT:  Evaluation of macrocytosis   ASSESSMENT & PLAN:   Macrocytosis Macrocytosis without anemia. Adequate B12 and folate level. Check CBC, smear, multiple myeloma panel, light chain ratio, LDH, haptoglobin, CMP, MMA. Today's CBC showed decreased MCV to 100.   Orders Placed This Encounter  Procedures   CBC with Differential/Platelet    Standing Status:   Future    Number of Occurrences:   1    Expected Date:   02/03/2024    Expiration Date:   02/02/2025   Multiple Myeloma Panel (SPEP&IFE w/QIG)    Standing Status:   Future    Number of Occurrences:   1    Expected Date:   02/03/2024    Expiration Date:   02/02/2025   Kappa/lambda light chains    Standing Status:   Future    Number of Occurrences:   1    Expected Date:   02/03/2024    Expiration Date:   02/02/2025   Lactate dehydrogenase    Standing Status:   Future    Number of Occurrences:   1    Expected Date:   02/03/2024    Expiration Date:   02/02/2025   Haptoglobin    Standing Status:   Future    Number of Occurrences:   1    Expected Date:   02/03/2024    Expiration Date:   02/02/2025   Comprehensive metabolic panel with GFR    Standing Status:   Future    Number of Occurrences:   1    Expected Date:   02/03/2024    Expiration Date:   02/02/2025   Technologist smear review    Standing Status:   Future    Expiration Date:   02/02/2025    Clinical information::   macrocytosis   Methylmalonic acid, serum    Standing Status:   Future    Number of Occurrences:   1    Expected Date:   02/03/2024    Expiration Date:   02/02/2025   Follow-up in few weeks to review results. All questions were answered. The patient knows to call the clinic with any problems, questions or concerns.  Zelphia Cap, MD, PhD [facility] Hematology Oncology 02/03/2024   HISTORY OF  PRESENTING ILLNESS:   Sharon Chavez is a  68 y.o.  female with PMH listed below was seen in consultation at the request of  Buren Rock HERO, MD  for evaluation of macrocytosis  Discussed the use of AI scribe software for clinical note transcription with the patient, who gave verbal consent to proceed.   She recently had blood work done.  01/12/2024 cbc showed Hb 13.9, MCV 104 01/22/2024 B12 1701, Folate >20  She experiences migraines, which are exacerbated by chocolate consumption. She occasionally takes sumatriptan for migraines, which she used to take almost daily. She experiences occasional lightheadedness or dizziness upon standing, which she attributes to low blood pressure. She maintains good hydration by drinking a lot of water. She denies alcohol use.  She has a history of breathing difficulties, particularly in the mornings, which she attributes to allergies. She also reports excessive sweating at night for the past fifteen years.  No weight loss or drenching night sweats. She reports a good appetite.  MEDICAL HISTORY:  Past Medical History:  Diagnosis Date   Migraine  SURGICAL HISTORY: Past Surgical History:  Procedure Laterality Date   NO PAST SURGERIES      SOCIAL HISTORY: Social History   Socioeconomic History   Marital status: Single    Spouse name: Not on file   Number of children: Not on file   Years of education: Not on file   Highest education level: Not on file  Occupational History   Not on file  Tobacco Use   Smoking status: Never   Smokeless tobacco: Not on file  Vaping Use   Vaping status: Never Used  Substance and Sexual Activity   Alcohol use: Yes    Alcohol/week: 0.0 standard drinks of alcohol   Drug use: No   Sexual activity: Not on file  Other Topics Concern   Not on file  Social History Narrative   Not on file   Social Drivers of Health   Financial Resource Strain: Low Risk  (02/03/2024)   Overall Financial Resource Strain (CARDIA)     Difficulty of Paying Living Expenses: Not very hard  Food Insecurity: No Food Insecurity (02/03/2024)   Hunger Vital Sign    Worried About Running Out of Food in the Last Year: Never true    Ran Out of Food in the Last Year: Never true  Transportation Needs: No Transportation Needs (02/03/2024)   PRAPARE - Administrator, Civil Service (Medical): No    Lack of Transportation (Non-Medical): No  Physical Activity: Not on file  Stress: No Stress Concern Present (02/03/2024)   [employer] of Occupational Health - Occupational Stress Questionnaire    Feeling of Stress: Not at all  Social Connections: Not on file  Intimate Partner Violence: Not At Risk (02/03/2024)   Humiliation, Afraid, Rape, and Kick questionnaire    Fear of Current or Ex-Partner: No    Emotionally Abused: No    Physically Abused: No    Sexually Abused: No    FAMILY HISTORY: Family History  Problem Relation Age of Onset   Stroke Mother    Breast cancer Mother 3   Stroke Father    Breast cancer Sister 13 - 31   Heart disease Maternal Grandmother    Heart disease Maternal Grandfather    Prostate cancer Neg Hx    Bladder Cancer Neg Hx     ALLERGIES:  has no known allergies.  MEDICATIONS:  Current Outpatient Medications  Medication Sig Dispense Refill   aspirin  EC 81 MG tablet Take 1 tablet (81 mg total) by mouth daily. Swallow whole. 90 tablet 3   SUMAtriptan (IMITREX) 100 MG tablet      No current facility-administered medications for this visit.    Review of Systems  Constitutional:  Negative for appetite change, chills, fatigue and fever.  HENT:   Negative for hearing loss and voice change.   Eyes:  Negative for eye problems.  Respiratory:  Negative for chest tightness and cough.   Cardiovascular:  Negative for chest pain.  Gastrointestinal:  Negative for abdominal distention, abdominal pain and blood in stool.  Endocrine: Negative for hot flashes.  Genitourinary:  Negative for  difficulty urinating and frequency.   Musculoskeletal:  Negative for arthralgias.  Skin:  Negative for itching and rash.  Neurological:  Negative for extremity weakness.  Hematological:  Negative for adenopathy.  Psychiatric/Behavioral:  Negative for confusion.    PHYSICAL EXAMINATION:  Vitals:   02/03/24 1118  BP: 99/69  Pulse: 68  Resp: 18  Temp: (!) 97.2 F (36.2 C)  SpO2: 98%  Filed Weights   02/03/24 1118  Weight: 126 lb 1.6 oz (57.2 kg)    Physical Exam Constitutional:      General: She is not in acute distress. HENT:     Head: Normocephalic and atraumatic.  Eyes:     General: No scleral icterus. Cardiovascular:     Rate and Rhythm: Normal rate and regular rhythm.     Heart sounds: Normal heart sounds.  Pulmonary:     Effort: Pulmonary effort is normal. No respiratory distress.     Breath sounds: No wheezing.  Abdominal:     General: Bowel sounds are normal. There is no distension.     Palpations: Abdomen is soft.  Musculoskeletal:        General: No deformity. Normal range of motion.     Cervical back: Normal range of motion and neck supple.  Skin:    General: Skin is warm and dry.     Findings: No erythema or rash.  Neurological:     Mental Status: She is alert and oriented to person, place, and time. Mental status is at baseline.     Cranial Nerves: No cranial nerve deficit.     Coordination: Coordination normal.  Psychiatric:        Mood and Affect: Mood normal.     LABORATORY DATA:  I have reviewed the data as listed    Latest Ref Rng & Units 02/03/2024   11:49 AM  CBC  WBC 4.0 - 10.5 K/uL 4.3   Hemoglobin 12.0 - 15.0 g/dL 85.0   Hematocrit 63.9 - 46.0 % 43.9   Platelets 150 - 400 K/uL 184       Latest Ref Rng & Units 02/03/2024   11:49 AM  CMP  Glucose 70 - 99 mg/dL 98   BUN 8 - 23 mg/dL 25   Creatinine 9.55 - 1.00 mg/dL 9.30   Sodium 864 - 854 mmol/L 136   Potassium 3.5 - 5.1 mmol/L 4.3   Chloride 98 - 111 mmol/L 104   CO2 22 - 32  mmol/L 25   Calcium  8.9 - 10.3 mg/dL 9.3   Total Protein 6.5 - 8.1 g/dL 7.2   Total Bilirubin 0.0 - 1.2 mg/dL 0.9   Alkaline Phos 38 - 126 U/L 70   AST 15 - 41 U/L 25   ALT 0 - 44 U/L 19       RADIOGRAPHIC STUDIES: I have personally reviewed the radiological images as listed and agreed with the findings in the report. No results found.

## 2024-02-04 LAB — HAPTOGLOBIN: Haptoglobin: 93 mg/dL (ref 37–355)

## 2024-02-05 LAB — KAPPA/LAMBDA LIGHT CHAINS
Kappa free light chain: 13.3 mg/L (ref 3.3–19.4)
Kappa, lambda light chain ratio: 0.92 (ref 0.26–1.65)
Lambda free light chains: 14.5 mg/L (ref 5.7–26.3)

## 2024-02-05 LAB — MULTIPLE MYELOMA PANEL, SERUM
Albumin SerPl Elph-Mcnc: 3.9 g/dL (ref 2.9–4.4)
Albumin/Glob SerPl: 1.6 (ref 0.7–1.7)
Alpha 1: 0.2 g/dL (ref 0.0–0.4)
Alpha2 Glob SerPl Elph-Mcnc: 0.7 g/dL (ref 0.4–1.0)
B-Globulin SerPl Elph-Mcnc: 1 g/dL (ref 0.7–1.3)
Gamma Glob SerPl Elph-Mcnc: 0.8 g/dL (ref 0.4–1.8)
Globulin, Total: 2.6 g/dL (ref 2.2–3.9)
IgA: 207 mg/dL (ref 87–352)
IgG (Immunoglobin G), Serum: 728 mg/dL (ref 586–1602)
IgM (Immunoglobulin M), Srm: 46 mg/dL (ref 26–217)
Total Protein ELP: 6.5 g/dL (ref 6.0–8.5)

## 2024-02-06 LAB — METHYLMALONIC ACID, SERUM: Methylmalonic Acid, Quantitative: 152 nmol/L (ref 0–378)

## 2024-05-17 ENCOUNTER — Inpatient Hospital Stay

## 2024-05-17 ENCOUNTER — Encounter: Payer: Self-pay | Admitting: Oncology

## 2024-05-17 ENCOUNTER — Inpatient Hospital Stay: Attending: Oncology | Admitting: Oncology

## 2024-05-17 VITALS — BP 100/81 | HR 63 | Temp 97.2°F | Resp 18 | Wt 127.5 lb

## 2024-05-17 DIAGNOSIS — D7589 Other specified diseases of blood and blood-forming organs: Secondary | ICD-10-CM | POA: Insufficient documentation

## 2024-05-17 DIAGNOSIS — Z803 Family history of malignant neoplasm of breast: Secondary | ICD-10-CM | POA: Insufficient documentation

## 2024-05-17 DIAGNOSIS — G43909 Migraine, unspecified, not intractable, without status migrainosus: Secondary | ICD-10-CM | POA: Diagnosis not present

## 2024-05-17 DIAGNOSIS — R42 Dizziness and giddiness: Secondary | ICD-10-CM | POA: Insufficient documentation

## 2024-05-17 LAB — CBC WITH DIFFERENTIAL (CANCER CENTER ONLY)
Abs Immature Granulocytes: 0.01 K/uL (ref 0.00–0.07)
Basophils Absolute: 0 K/uL (ref 0.0–0.1)
Basophils Relative: 1 %
Eosinophils Absolute: 0.1 K/uL (ref 0.0–0.5)
Eosinophils Relative: 2 %
HCT: 42.9 % (ref 36.0–46.0)
Hemoglobin: 14.5 g/dL (ref 12.0–15.0)
Immature Granulocytes: 0 %
Lymphocytes Relative: 36 %
Lymphs Abs: 1.5 K/uL (ref 0.7–4.0)
MCH: 34.1 pg — ABNORMAL HIGH (ref 26.0–34.0)
MCHC: 33.8 g/dL (ref 30.0–36.0)
MCV: 100.9 fL — ABNORMAL HIGH (ref 80.0–100.0)
Monocytes Absolute: 0.3 K/uL (ref 0.1–1.0)
Monocytes Relative: 8 %
Neutro Abs: 2.3 K/uL (ref 1.7–7.7)
Neutrophils Relative %: 53 %
Platelet Count: 194 K/uL (ref 150–400)
RBC: 4.25 MIL/uL (ref 3.87–5.11)
RDW: 11.9 % (ref 11.5–15.5)
WBC Count: 4.3 K/uL (ref 4.0–10.5)
nRBC: 0 % (ref 0.0–0.2)

## 2024-05-17 LAB — TECHNOLOGIST SMEAR REVIEW: Plt Morphology: ADEQUATE

## 2024-05-17 LAB — FOLATE: Folate: 18.4 ng/mL

## 2024-05-17 LAB — VITAMIN B12: Vitamin B-12: 608 pg/mL (ref 180–914)

## 2024-05-17 NOTE — Progress Notes (Signed)
 " Hematology/Oncology Progress note [phone] [phone]           REFERRING PROVIDER: Buren Rock HERO, MD   CHIEF COMPLAINTS/REASON FOR VISIT:   macrocytosis   ASSESSMENT & PLAN:   Macrocytosis Macrocytosis without anemia. Previous workup showed adequate B12 and folate level. No M protein on protein electrophoresis.  Normal LDH and haptoglobin less likely hemolysis..  Normal MMA. Repeat CBC, B12 and folate today. Discussed with patient that underlying bone marrow disorders, ie. early MDS cannot be ruled out.  Given that patient is currently asymptomatic and has no cytopenia.  I recommend observation Q 6 months.   Orders Placed This Encounter  Procedures   CBC with Differential (Cancer Center Only)    Standing Status:   Future    Expected Date:   11/14/2024    Expiration Date:   02/12/2025   CBC with Differential (Cancer Center Only)    Standing Status:   Future    Number of Occurrences:   1    Expected Date:   05/17/2024    Expiration Date:   08/15/2024   Vitamin B12    Standing Status:   Future    Number of Occurrences:   1    Expected Date:   05/17/2024    Expiration Date:   08/15/2024   Folate    Standing Status:   Future    Number of Occurrences:   1    Expected Date:   05/17/2024    Expiration Date:   08/15/2024   Follow-up in 6 months. All questions were answered. The patient knows to call the clinic with any problems, questions or concerns.  Zelphia Cap, MD, PhD [facility] Hematology Oncology 05/17/2024   HISTORY OF PRESENTING ILLNESS:   Sharon Chavez is a  69 y.o.  female with PMH listed below was seen in consultation at the request of  Buren Rock HERO, MD  for evaluation of macrocytosis  Discussed the use of AI scribe software for clinical note transcription with the patient, who gave verbal consent to proceed.   She recently had blood work done.  01/12/2024 cbc showed Hb 13.9, MCV 104 01/22/2024 B12 1701, Folate >20  She experiences  migraines, which are exacerbated by chocolate consumption. She occasionally takes sumatriptan for migraines, which she used to take almost daily. She experiences occasional lightheadedness or dizziness upon standing, which she attributes to low blood pressure. She maintains good hydration by drinking a lot of water. She denies alcohol use.  INTERVAL HISTORY Sharon Chavez is a 69 y.o. female who has above history reviewed by me today presents for follow up visit for macrocytosis.  Patient denies taking much over-the-counter supplements. No weight loss or drenching night sweats. She reports a good appetite.  MEDICAL HISTORY:  Past Medical History:  Diagnosis Date   Migraine     SURGICAL HISTORY: Past Surgical History:  Procedure Laterality Date   NO PAST SURGERIES      SOCIAL HISTORY: Social History   Socioeconomic History   Marital status: Single    Spouse name: Not on file   Number of children: Not on file   Years of education: Not on file   Highest education level: Not on file  Occupational History   Not on file  Tobacco Use   Smoking status: Never   Smokeless tobacco: Not on file  Vaping Use   Vaping status: Never Used  Substance and Sexual Activity   Alcohol use: Yes  Alcohol/week: 0.0 standard drinks of alcohol   Drug use: No   Sexual activity: Not on file  Other Topics Concern   Not on file  Social History Narrative   Not on file   Social Drivers of Health   Tobacco Use: Unknown (05/17/2024)   Patient History    Smoking Tobacco Use: Never    Smokeless Tobacco Use: Unknown    Passive Exposure: Not on file  Financial Resource Strain: Low Risk (02/03/2024)   Overall Financial Resource Strain (CARDIA)    Difficulty of Paying Living Expenses: Not very hard  Food Insecurity: No Food Insecurity (02/03/2024)   Epic    Worried About Radiation Protection Practitioner of Food in the Last Year: Never true    Ran Out of Food in the Last Year: Never true  Transportation Needs: No  Transportation Needs (02/03/2024)   Epic    Lack of Transportation (Medical): No    Lack of Transportation (Non-Medical): No  Physical Activity: Not on file  Stress: No Stress Concern Present (02/03/2024)   [employer] of Occupational Health - Occupational Stress Questionnaire    Feeling of Stress: Not at all  Social Connections: Not on file  Intimate Partner Violence: Not At Risk (02/03/2024)   Epic    Fear of Current or Ex-Partner: No    Emotionally Abused: No    Physically Abused: No    Sexually Abused: No  Depression (PHQ2-9): Low Risk (02/03/2024)   Depression (PHQ2-9)    PHQ-2 Score: 0  Alcohol Screen: Not on file  Housing: Low Risk (02/03/2024)   Epic    Unable to Pay for Housing in the Last Year: No    Number of Times Moved in the Last Year: 0    Homeless in the Last Year: No  Utilities: Not At Risk (02/03/2024)   Epic    Threatened with loss of utilities: No  Health Literacy: Adequate Health Literacy (02/03/2024)   B1300 Health Literacy    Frequency of need for help with medical instructions: Never    FAMILY HISTORY: Family History  Problem Relation Age of Onset   Stroke Mother    Breast cancer Mother 83   Stroke Father    Breast cancer Sister 78 - 69   Heart disease Maternal Grandmother    Heart disease Maternal Grandfather    Prostate cancer Neg Hx    Bladder Cancer Neg Hx     ALLERGIES:  has no known allergies.  MEDICATIONS:  Current Outpatient Medications  Medication Sig Dispense Refill   aspirin  EC 81 MG tablet Take 1 tablet (81 mg total) by mouth daily. Swallow whole. 90 tablet 3   SUMAtriptan (IMITREX) 100 MG tablet      No current facility-administered medications for this visit.    Review of Systems  Constitutional:  Negative for appetite change, chills, fatigue and fever.  HENT:   Negative for hearing loss and voice change.   Eyes:  Negative for eye problems.  Respiratory:  Negative for chest tightness and cough.   Cardiovascular:   Negative for chest pain.  Gastrointestinal:  Negative for abdominal distention, abdominal pain and blood in stool.  Endocrine: Negative for hot flashes.  Genitourinary:  Negative for difficulty urinating and frequency.   Musculoskeletal:  Negative for arthralgias.  Skin:  Negative for itching and rash.  Neurological:  Negative for extremity weakness.  Hematological:  Negative for adenopathy.  Psychiatric/Behavioral:  Negative for confusion.    PHYSICAL EXAMINATION:  Vitals:   05/17/24 1121  BP: 100/81  Pulse: 63  Resp: 18  Temp: (!) 97.2 F (36.2 C)   Filed Weights   05/17/24 1121  Weight: 127 lb 8 oz (57.8 kg)    Physical Exam Constitutional:      General: She is not in acute distress. HENT:     Head: Normocephalic and atraumatic.  Eyes:     General: No scleral icterus. Cardiovascular:     Rate and Rhythm: Normal rate and regular rhythm.  Pulmonary:     Effort: Pulmonary effort is normal. No respiratory distress.     Breath sounds: Normal breath sounds. No wheezing.  Abdominal:     General: There is no distension.  Musculoskeletal:        General: No deformity. Normal range of motion.     Cervical back: Normal range of motion and neck supple.  Skin:    Findings: No erythema or rash.  Neurological:     Mental Status: She is alert and oriented to person, place, and time. Mental status is at baseline.  Psychiatric:        Mood and Affect: Mood normal.     LABORATORY DATA:  I have reviewed the data as listed    Latest Ref Rng & Units 05/17/2024   12:00 PM 02/03/2024   11:49 AM  CBC  WBC 4.0 - 10.5 K/uL 4.3  4.3   Hemoglobin 12.0 - 15.0 g/dL 85.4  85.0   Hematocrit 36.0 - 46.0 % 42.9  43.9   Platelets 150 - 400 K/uL 194  184       Latest Ref Rng & Units 02/03/2024   11:49 AM  CMP  Glucose 70 - 99 mg/dL 98   BUN 8 - 23 mg/dL 25   Creatinine 9.55 - 1.00 mg/dL 9.30   Sodium 864 - 854 mmol/L 136   Potassium 3.5 - 5.1 mmol/L 4.3   Chloride 98 - 111 mmol/L  104   CO2 22 - 32 mmol/L 25   Calcium  8.9 - 10.3 mg/dL 9.3   Total Protein 6.5 - 8.1 g/dL 7.2   Total Bilirubin 0.0 - 1.2 mg/dL 0.9   Alkaline Phos 38 - 126 U/L 70   AST 15 - 41 U/L 25   ALT 0 - 44 U/L 19       RADIOGRAPHIC STUDIES: I have personally reviewed the radiological images as listed and agreed with the findings in the report. No results found.       "

## 2024-05-17 NOTE — Assessment & Plan Note (Addendum)
 Macrocytosis without anemia. Previous workup showed adequate B12 and folate level. No M protein on protein electrophoresis.  Normal LDH and haptoglobin less likely hemolysis..  Normal MMA. Repeat CBC, B12 and folate today. Discussed with patient that underlying bone marrow disorders, ie. early MDS cannot be ruled out.  Given that patient is currently asymptomatic and has no cytopenia.  I recommend observation Q 6 months.

## 2024-11-14 ENCOUNTER — Inpatient Hospital Stay: Admitting: Oncology

## 2024-11-14 ENCOUNTER — Inpatient Hospital Stay
